# Patient Record
Sex: Female | Born: 1965 | Race: Black or African American | Hispanic: No | Marital: Single | Smoking: Never smoker
Health system: Southern US, Community
[De-identification: ages and names within clinical notes are randomized; demographics above are authoritative.]

## PROBLEM LIST (undated history)

## (undated) HISTORY — PX: ABDOMINAL HYSTERECTOMY: SHX81

---

## 2017-07-21 ENCOUNTER — Encounter (HOSPITAL_COMMUNITY): Payer: Self-pay

## 2017-07-21 ENCOUNTER — Emergency Department (HOSPITAL_COMMUNITY)
Admission: EM | Admit: 2017-07-21 | Discharge: 2017-07-21 | Disposition: A | Discharge status: Home / Self Care | Payer: Self-pay | Attending: Emergency Medicine | Admitting: Emergency Medicine

## 2017-07-21 DIAGNOSIS — S39012A Strain of muscle, fascia and tendon of lower back, initial encounter: Secondary | ICD-10-CM | POA: Insufficient documentation

## 2017-07-21 DIAGNOSIS — X509XXA Other and unspecified overexertion or strenuous movements or postures, initial encounter: Secondary | ICD-10-CM | POA: Insufficient documentation

## 2017-07-21 DIAGNOSIS — Y999 Unspecified external cause status: Secondary | ICD-10-CM | POA: Insufficient documentation

## 2017-07-21 DIAGNOSIS — Y9389 Activity, other specified: Secondary | ICD-10-CM | POA: Insufficient documentation

## 2017-07-21 DIAGNOSIS — Y929 Unspecified place or not applicable: Secondary | ICD-10-CM | POA: Insufficient documentation

## 2017-07-21 MED ORDER — CYCLOBENZAPRINE HCL 10 MG PO TABS: 10.0000 mg | ORAL_TABLET | Freq: Two times a day (BID) | ORAL | 0 refills | Status: AC | PRN

## 2017-07-21 MED ORDER — DICLOFENAC SODIUM 50 MG PO TBEC: 50.0000 mg | DELAYED_RELEASE_TABLET | Freq: Two times a day (BID) | ORAL | 0 refills | Status: AC

## 2017-07-21 NOTE — ED Notes (Signed)
Pt requesting to speak with registration about her medicaid prior to leaving. Registration made aware.

## 2017-07-21 NOTE — ED Triage Notes (Signed)
Pt reports lower R sided back pain following lifting her daughter's wheelchair out of the car. She states it happened around 3/ A&Ox4. Ambulatory. She states "I think I pulled something."

## 2017-07-21 NOTE — Discharge Instructions (Signed)
Do not drive while taking the muscle relaxant as it can make you sleepy °

## 2017-07-21 NOTE — ED Provider Notes (Signed)
[facility]-EMERGENCY DEPT Provider Note   CSN: [id number] Arrival date & time: 07/21/17  1538   By signing my name below, I, Soijett Blue, attest that this documentation has been prepared under the direction and in the presence of Kerrie BuffaloHope Kymorah Korf, NP Electronically Signed: Soijett Blue, ED Scribe. 07/21/17. 5:07 PM.  History   Chief Complaint Chief Complaint  Patient presents with  . Back Pain    HPI Joy Richardson is a [age] y.o. female who presents to the Emergency Department complaining of right lower back pain onset today. Pt has tried a lidocaine patch without medications for the relief of her symptoms. Pt reports that she was lifting her daughters wheelchair out of her car prior to the onset of her symptoms. Pt states that the wheelchair got caught on her pants which caused it to fall to the ground and she felt a pull to her right lower back. She states that she had a prior similar injury 3 weeks ago when she attempted to pick up her daughter. Pt right lower back pain is exacerbated with sitting and alleviated with standing. Pt denies bowel/bladder incontinence, numbness, tingling, nausea, vomiting, fever, chills, dysuria, urinary frequency, color change, wound, and any other symptoms.     The history is provided by the patient. No language interpreter was used.  Back Pain   This is a recurrent problem. The current episode started 3 to 5 hours ago. The problem occurs every several days. The problem has not changed since onset.The pain is associated with lifting heavy objects. The pain is present in the lumbar spine. The pain does not radiate. Exacerbated by: sitting. The pain is the same all the time. Pertinent negatives include no fever, no numbness and no dysuria.    History reviewed. No pertinent past medical history.  There are no active problems to display for this patient.   No past surgical history on file.  OB History    No data available       Home Medications    Prior to  Admission medications   Medication Sig Start Date End Date Taking? Authorizing Provider  cyclobenzaprine (FLEXERIL) 10 MG tablet Take 1 tablet (10 mg total) by mouth 2 (two) times daily as needed for muscle spasms. 07/21/17   Janne NapoleonNeese, Matsue Strom M, NP  diclofenac (VOLTAREN) 50 MG EC tablet Take 1 tablet (50 mg total) by mouth 2 (two) times daily. 07/21/17   Janne NapoleonNeese, Meg Niemeier M, NP    Family History History reviewed. No pertinent family history.  Social History Social History  Substance Use Topics  . Smoking status: Not on file  . Smokeless tobacco: Not on file  . Alcohol use Not on file     Allergies   Patient has no known allergies.   Review of Systems Review of Systems  Constitutional: Negative for chills and fever.  Gastrointestinal: Negative for nausea and vomiting.       No bowel incontinence  Genitourinary: Negative for difficulty urinating, dysuria, frequency and urgency.       No bladder incontinence  Musculoskeletal: Positive for back pain (right lower). Negative for joint swelling.  Skin: Negative for color change and wound.  Neurological: Negative for numbness.       No tingling  All other systems reviewed and are negative.    Physical Exam Updated Vital Signs BP 127/89 (BP Location: Left Arm)   Pulse (!) 57   Temp 98.2 F (36.8 C) (Oral)   Resp 20   Ht 5\' 7"  (1.702 m)  Wt 275 lb (124.7 kg)   SpO2 97%   BMI 43.07 kg/m   Physical Exam  Constitutional: She appears well-developed and well-nourished. No distress.  HENT:  Head: Normocephalic and atraumatic.  Eyes: EOM are normal.  Neck: Neck supple.  Cardiovascular: Normal rate and regular rhythm.   Pulses:      Radial pulses are 2+ on the right side, and 2+ on the left side.  Radial pulses are 2+.   Pulmonary/Chest: Effort normal and breath sounds normal.  Abdominal: Soft. There is no tenderness. There is no CVA tenderness.  Musculoskeletal: Normal range of motion.       Lumbar back: She exhibits tenderness and  spasm. She exhibits no deformity and normal pulse. Decreased range of motion: due to pain.  Tenderness to the right lower lumbar area. Grips are equal.   Neurological: She is alert. She has normal strength. Gait normal.  Reflex Scores:      Bicep reflexes are 2+ on the right side and 2+ on the left side.      Brachioradialis reflexes are 2+ on the right side and 2+ on the left side.      Patellar reflexes are 2+ on the right side and 2+ on the left side. Skin: Skin is warm and dry.  Psychiatric: She has a normal mood and affect. Her behavior is normal.  Nursing note and vitals reviewed.    ED Treatments / Results  DIAGNOSTIC STUDIES: Oxygen Saturation is 97% on RA, nl by my interpretation.    COORDINATION OF CARE: 4:54 PM Discussed treatment plan with pt at bedside and pt agreed to plan.   Labs (all labs ordered are listed, but only abnormal results are displayed) Labs Reviewed - No data to display  Procedures Procedures (including critical care time)  Medications Ordered in ED Medications - No data to display   Initial Impression / Assessment and Plan / ED Course  I have reviewed the triage vital signs and the nursing notes.  Patient with back pain.  No neurological deficits and normal neuro exam.  Patient is ambulatory.  No loss of bowel or bladder control.  No concern for cauda equina.  No fever, night sweats, weight loss, h/o cancer, IVDA, no recent procedure to back. No urinary symptoms suggestive of UTI.  Pt will be discharged home with flexeril and voltaren prescription. Supportive care and return precaution discussed. Appears safe for discharge at this time. Follow up as indicated in discharge paperwork.   Final Clinical Impressions(s) / ED Diagnoses   Final diagnoses:  Lumbosacral strain, initial encounter    New Prescriptions New Prescriptions   CYCLOBENZAPRINE (FLEXERIL) 10 MG TABLET    Take 1 tablet (10 mg total) by mouth 2 (two) times daily as needed for  muscle spasms.   DICLOFENAC (VOLTAREN) 50 MG EC TABLET    Take 1 tablet (50 mg total) by mouth 2 (two) times daily.  I personally performed the services described in this documentation, which was scribed in my presence. The recorded information has been reviewed and is accurate.    Kerrie Buffalo [place], [place] 07/21/17 Garnette Scheuermann    Benjiman Core, MD 07/22/17 [phone]

## 2018-02-22 ENCOUNTER — Encounter (HOSPITAL_COMMUNITY): Payer: Self-pay | Admitting: Emergency Medicine

## 2018-02-22 ENCOUNTER — Emergency Department (HOSPITAL_COMMUNITY)
Admission: EM | Admit: 2018-02-22 | Discharge: 2018-02-22 | Disposition: A | Discharge status: Home / Self Care | Payer: Self-pay | Attending: Emergency Medicine | Admitting: Emergency Medicine

## 2018-02-22 ENCOUNTER — Emergency Department (HOSPITAL_COMMUNITY): Payer: Self-pay

## 2018-02-22 DIAGNOSIS — B9789 Other viral agents as the cause of diseases classified elsewhere: Secondary | ICD-10-CM | POA: Insufficient documentation

## 2018-02-22 DIAGNOSIS — J40 Bronchitis, not specified as acute or chronic: Secondary | ICD-10-CM

## 2018-02-22 DIAGNOSIS — Z79899 Other long term (current) drug therapy: Secondary | ICD-10-CM | POA: Insufficient documentation

## 2018-02-22 DIAGNOSIS — J069 Acute upper respiratory infection, unspecified: Secondary | ICD-10-CM

## 2018-02-22 MED ORDER — ACETAMINOPHEN-CODEINE 120-12 MG/5ML PO SOLN: 10.0000 mL | ORAL | 0 refills | Status: AC | PRN

## 2018-02-22 MED ORDER — IPRATROPIUM BROMIDE 0.02 % IN SOLN
0.5000 mg | Freq: Once | RESPIRATORY_TRACT | Status: AC
  Administered 2018-02-22: 0.5 mg via RESPIRATORY_TRACT
  Filled 2018-02-22: qty 2.5

## 2018-02-22 MED ORDER — ALBUTEROL SULFATE (2.5 MG/3ML) 0.083% IN NEBU
5.0000 mg | INHALATION_SOLUTION | Freq: Once | RESPIRATORY_TRACT | Status: DC
Start: 2018-02-22 — End: 2018-02-22

## 2018-02-22 MED ORDER — GUAIFENESIN ER 1200 MG PO TB12: 1.0000 | ORAL_TABLET | Freq: Two times a day (BID) | ORAL | 0 refills | Status: AC

## 2018-02-22 MED ORDER — PREDNISONE 50 MG PO TABS: 50.0000 mg | ORAL_TABLET | Freq: Every day | ORAL | 0 refills | Status: DC

## 2018-02-22 MED ORDER — ALBUTEROL SULFATE (2.5 MG/3ML) 0.083% IN NEBU
5.0000 mg | INHALATION_SOLUTION | Freq: Once | RESPIRATORY_TRACT | Status: AC
  Administered 2018-02-22: 5 mg via RESPIRATORY_TRACT
  Filled 2018-02-22: qty 6

## 2018-02-22 NOTE — ED Triage Notes (Signed)
Per pt, states chest/nasal congestion, productive cough on and off for 2 weeks-states increased symptoms, especially cough yesterday-

## 2018-02-22 NOTE — ED Provider Notes (Signed)
[facility]-EMERGENCY DEPT Provider Note   CSN: [id number] Arrival date & time: 02/22/18  [id number]     History   Chief Complaint Chief Complaint  Patient presents with  . URI    HPI Joy Richardson is a 52 y.o. female.  HPI Patient presents to the emergency department with nasal congestion and productive cough over the last 2 weeks.  The patient states that her cough seems to bother her more at night.  The patient states that she did not take any medications other than TheraFlu prior to arrival which did not seem to help significantly with her symptoms.  Patient states that she has not been around anyone sick that she knows of.  The patient denies chest pain, shortness of breath, headache,blurred vision, neck pain, fever,  weakness, numbness, dizziness, anorexia, edema, abdominal pain, nausea, vomiting, diarrhea, rash, back pain, dysuria, hematemesis, bloody stool, near syncope, or syncope. History reviewed. No pertinent past medical history.  There are no active problems to display for this patient.    OB History    No data available       Home Medications    Prior to Admission medications   Medication Sig Start Date End Date Taking? Authorizing Provider  cyclobenzaprine (FLEXERIL) 10 MG tablet Take 1 tablet (10 mg total) by mouth 2 (two) times daily as needed for muscle spasms. 07/21/17  Yes Neese, Hope M, NP  diclofenac (VOLTAREN) 50 MG EC tablet Take 1 tablet (50 mg total) by mouth 2 (two) times daily. 07/21/17  Yes Janne NapoleonNeese, Hope M, NP    Family History No family history on file.  Social History Social History   Tobacco Use  . Smoking status: Never Smoker  Substance Use Topics  . Alcohol use: No    Frequency: Never  . Drug use: No     Allergies   Patient has no known allergies.   Review of Systems Review of Systems All other systems negative except as documented in the HPI. All pertinent positives and negatives as reviewed in the  HPI.  Physical Exam Updated Vital Signs BP (!) 165/96 (BP Location: Right Arm)   Pulse 66   Temp 98.2 F (36.8 C) (Oral)   Resp 18   Ht 5\' 7"  (1.702 m)   Wt 127 kg (280 lb)   SpO2 98%   BMI 43.85 kg/m   Physical Exam  Constitutional: She is oriented to person, place, and time. She appears well-developed and well-nourished. No distress.  HENT:  Head: Normocephalic and atraumatic.  Mouth/Throat: Oropharynx is clear and moist.  Eyes: Pupils are equal, round, and reactive to light.  Neck: Normal range of motion. Neck supple.  Cardiovascular: Normal rate, regular rhythm and normal heart sounds. Exam reveals no gallop and no friction rub.  No murmur heard. Pulmonary/Chest: Effort normal and breath sounds normal. No respiratory distress. She has no wheezes.  Neurological: She is alert and oriented to person, place, and time. She exhibits normal muscle tone. Coordination normal.  Skin: Skin is warm and dry. Capillary refill takes less than 2 seconds. No rash noted. No erythema.  Psychiatric: She has a normal mood and affect. Her behavior is normal.  Nursing note and vitals reviewed.    ED Treatments / Results  Labs (all labs ordered are listed, but only abnormal results are displayed) Labs Reviewed - No data to display  EKG  EKG Interpretation None       Radiology Dg Chest 2 View  Result Date: 02/22/2018  CLINICAL DATA:  Shortness of breath with congestion, wheezing, cough and fever. EXAM: CHEST - 2 VIEW COMPARISON:  None. FINDINGS: The heart size and mediastinal contours are within normal limits. Both lungs are clear. The visualized skeletal structures are unremarkable. IMPRESSION: No active cardiopulmonary disease. Electronically Signed   By: Elberta Fortis M.D.   On: 02/22/2018 09:14    Procedures Procedures (including critical care time)  Medications Ordered in ED Medications  albuterol (PROVENTIL) (2.5 MG/3ML) 0.083% nebulizer solution 5 mg (5 mg Nebulization Given  02/22/18 1132)  ipratropium (ATROVENT) nebulizer solution 0.5 mg (0.5 mg Nebulization Given 02/22/18 1132)     Initial Impression / Assessment and Plan / ED Course  I have reviewed the triage vital signs and the nursing notes.  Pertinent labs & imaging results that were available during my care of the patient were reviewed by me and considered in my medical decision making (see chart for details).     Patient be treated for bronchitis and advised to follow-up with her primary doctor.  Told return here as needed.  Patient agrees with plan and all questions were answered.  Final Clinical Impressions(s) / ED Diagnoses   Final diagnoses:  None    ED Discharge Orders    None       Charlestine Night, PA-C 02/26/18 1601    Cathren Laine, MD 02/27/18 [phone]

## 2018-02-22 NOTE — Discharge Instructions (Signed)
Return here as needed.  Increase your fluid intake and rest as much as possible.  °

## 2018-07-21 ENCOUNTER — Emergency Department (HOSPITAL_COMMUNITY)
Admission: EM | Admit: 2018-07-21 | Discharge: 2018-07-21 | Disposition: A | Discharge status: Home / Self Care | Payer: Medicaid Other | Attending: Emergency Medicine | Admitting: Emergency Medicine

## 2018-07-21 ENCOUNTER — Other Ambulatory Visit: Payer: Self-pay

## 2018-07-21 ENCOUNTER — Encounter (HOSPITAL_COMMUNITY): Payer: Self-pay | Admitting: Obstetrics and Gynecology

## 2018-07-21 DIAGNOSIS — Z79899 Other long term (current) drug therapy: Secondary | ICD-10-CM | POA: Insufficient documentation

## 2018-07-21 DIAGNOSIS — L02411 Cutaneous abscess of right axilla: Secondary | ICD-10-CM

## 2018-07-21 MED ORDER — SULFAMETHOXAZOLE-TRIMETHOPRIM 800-160 MG PO TABS: 1.0000 | ORAL_TABLET | Freq: Two times a day (BID) | ORAL | 0 refills | Status: AC

## 2018-07-21 MED ORDER — SULFAMETHOXAZOLE-TRIMETHOPRIM 800-160 MG PO TABS: 1.0000 | ORAL_TABLET | Freq: Two times a day (BID) | ORAL | 0 refills | Status: DC

## 2018-07-21 NOTE — Discharge Instructions (Addendum)
Warm Epsom salt compresses to right armpit area for 20 minutes at a time. Take Septra as prescribed and complete the full course. Return to ER for any worsening or concerning symptoms.

## 2018-07-21 NOTE — ED Provider Notes (Signed)
[facility]   CSN: [id number] Arrival date & time: 07/21/18  1510     History   Chief Complaint Chief Complaint  Patient presents with  . Abscess    HPI Joy Richardson is a 52 y.o. female.  52 year old female presents with complaint of abscess in the right axillary area.  Patient first noticed pain in the area a few days ago, has been applying warm compresses, now has purulent drainage from the area.  Patient reports similar episode back in May, did not have I&D at that time.  No history of hidradenitis, no history of MRSA.  No other complaints or concerns.     History reviewed. No pertinent past medical history.  There are no active problems to display for this patient.   Past Surgical History:  Procedure Laterality Date  . ABDOMINAL HYSTERECTOMY       OB History    Gravida      Para      Term      Preterm      AB      Living  5     SAB      TAB      Ectopic      Multiple      Live Births               Home Medications    Prior to Admission medications   Medication Sig Start Date End Date Taking? Authorizing Provider  acetaminophen-codeine 120-12 MG/5ML solution Take 10 mLs by mouth every 4 (four) hours as needed (PRN cough). 02/22/18   Lawyer, Cristal Deerhristopher, PA-C  cyclobenzaprine (FLEXERIL) 10 MG tablet Take 1 tablet (10 mg total) by mouth 2 (two) times daily as needed for muscle spasms. 07/21/17   Janne NapoleonNeese, Hope M, NP  diclofenac (VOLTAREN) 50 MG EC tablet Take 1 tablet (50 mg total) by mouth 2 (two) times daily. 07/21/17   Janne NapoleonNeese, Hope M, NP  Guaifenesin 1200 MG TB12 Take 1 tablet (1,200 mg total) by mouth 2 (two) times daily. 02/22/18   Lawyer, Cristal Deerhristopher, PA-C  predniSONE (DELTASONE) 50 MG tablet Take 1 tablet (50 mg total) by mouth daily with breakfast. 02/22/18   Lawyer, Cristal Deerhristopher, PA-C  sulfamethoxazole-trimethoprim (BACTRIM DS,SEPTRA DS) 800-160 MG tablet Take 1 tablet by mouth 2 (two) times  daily for 7 days. 07/21/18 07/28/18  Jeannie FendMurphy, Nicolas Banh A, PA-C    Family History No family history on file.  Social History Social History   Tobacco Use  . Smoking status: Never Smoker  . Smokeless tobacco: Never Used  Substance Use Topics  . Alcohol use: No    Frequency: Never  . Drug use: No     Allergies   Patient has no known allergies.   Review of Systems Review of Systems  Constitutional: Negative for fever.  Gastrointestinal: Negative for nausea and vomiting.  Musculoskeletal: Negative for arthralgias and myalgias.  Skin: Positive for color change. Negative for rash and wound.  Allergic/Immunologic: Negative for immunocompromised state.  Hematological: Negative for adenopathy.  All other systems reviewed and are negative.    Physical Exam Updated Vital Signs BP (!) 156/94 (BP Location: Right Arm)   Pulse 72   Temp 98.3 F (36.8 C)   Resp 16   Ht 5\' 7"  (1.702 m)   Wt 124.7 kg   SpO2 99%   BMI 43.07 kg/m   Physical Exam  Constitutional: She is oriented to person, place, and time. She appears well-developed and well-nourished.  No distress.  HENT:  Head: Normocephalic and atraumatic.  Cardiovascular: Intact distal pulses.  Pulmonary/Chest: Effort normal.  Musculoskeletal: She exhibits tenderness. She exhibits no deformity.  Neurological: She is alert and oriented to person, place, and time.  Skin: Skin is warm and dry. She is not diaphoretic. There is erythema.     Psychiatric: She has a normal mood and affect. Her behavior is normal.  Nursing note and vitals reviewed.    ED Treatments / Results  Labs (all labs ordered are listed, but only abnormal results are displayed) Labs Reviewed - No data to display  EKG None  Radiology No results found.  Procedures Procedures (including critical care time)  Medications Ordered in ED Medications - No data to display   Initial Impression / Assessment and Plan / ED Course  I have reviewed the triage  vital signs and the nursing notes.  Pertinent labs & imaging results that were available during my care of the patient were reviewed by me and considered in my medical decision making (see chart for details).  Clinical Course as of Jul 21 1621  Wed Jul 21, 2018  [id number] 52 year old female with right axillary abscess, draining at this time.  Patient declines I&D of area at this time and would prefer to try warm compresses and antibiotics and allow the area to continue to drain at home.  Advised patient to return to the ER at any time if this is not improving for I&D.   [LM]    Clinical Course User Index [LM] Jeannie FendMurphy, Adalynne Steffensmeier A, PA-C    Final Clinical Impressions(s) / ED Diagnoses   Final diagnoses:  Abscess of axilla, right    ED Discharge Orders         Ordered    sulfamethoxazole-trimethoprim (BACTRIM DS,SEPTRA DS) 800-160 MG tablet  2 times daily,   Status:  Discontinued     07/21/18 1602    sulfamethoxazole-trimethoprim (BACTRIM DS,SEPTRA DS) 800-160 MG tablet  2 times daily     07/21/18 1619           Jeannie FendMurphy, Jasia Hiltunen A, PA-C 07/21/18 1622    Bethann BerkshireZammit, Joseph, MD 07/22/18 1214

## 2018-07-21 NOTE — ED Triage Notes (Signed)
Pt reports she has an abscess in her right armpit. RN visualized abscess and there is purulent drainage noted in the area.

## 2020-04-06 ENCOUNTER — Encounter (HOSPITAL_COMMUNITY): Payer: Self-pay

## 2020-04-06 ENCOUNTER — Emergency Department (HOSPITAL_COMMUNITY): Payer: Self-pay

## 2020-04-06 ENCOUNTER — Other Ambulatory Visit: Payer: Self-pay

## 2020-04-06 ENCOUNTER — Emergency Department (HOSPITAL_COMMUNITY)
Admission: EM | Admit: 2020-04-06 | Discharge: 2020-04-06 | Disposition: A | Discharge status: Home / Self Care | Payer: Self-pay | Attending: Emergency Medicine | Admitting: Emergency Medicine

## 2020-04-06 DIAGNOSIS — R42 Dizziness and giddiness: Secondary | ICD-10-CM | POA: Insufficient documentation

## 2020-04-06 DIAGNOSIS — R0602 Shortness of breath: Secondary | ICD-10-CM | POA: Insufficient documentation

## 2020-04-06 DIAGNOSIS — J069 Acute upper respiratory infection, unspecified: Secondary | ICD-10-CM | POA: Insufficient documentation

## 2020-04-06 DIAGNOSIS — Z20822 Contact with and (suspected) exposure to covid-19: Secondary | ICD-10-CM | POA: Insufficient documentation

## 2020-04-06 LAB — URINALYSIS, ROUTINE W REFLEX MICROSCOPIC
Bacteria, UA: NONE SEEN
Bilirubin Urine: NEGATIVE
Glucose, UA: NEGATIVE mg/dL
Ketones, ur: NEGATIVE mg/dL
Leukocytes,Ua: NEGATIVE
Nitrite: NEGATIVE
Protein, ur: NEGATIVE mg/dL
Specific Gravity, Urine: 1.017 (ref 1.005–1.030)
pH: 7 (ref 5.0–8.0)

## 2020-04-06 LAB — CBC
HCT: 37.1 % (ref 36.0–46.0)
Hemoglobin: 11.1 g/dL — ABNORMAL LOW (ref 12.0–15.0)
MCH: 24.2 pg — ABNORMAL LOW (ref 26.0–34.0)
MCHC: 29.9 g/dL — ABNORMAL LOW (ref 30.0–36.0)
MCV: 80.8 fL (ref 80.0–100.0)
Platelets: 373 10*3/uL (ref 150–400)
RBC: 4.59 MIL/uL (ref 3.87–5.11)
RDW: 15.2 % (ref 11.5–15.5)
WBC: 7.8 10*3/uL (ref 4.0–10.5)
nRBC: 0 % (ref 0.0–0.2)

## 2020-04-06 LAB — COMPREHENSIVE METABOLIC PANEL
ALT: 10 U/L (ref 0–44)
AST: 13 U/L — ABNORMAL LOW (ref 15–41)
Albumin: 3.7 g/dL (ref 3.5–5.0)
Alkaline Phosphatase: 88 U/L (ref 38–126)
Anion gap: 6 (ref 5–15)
BUN: 9 mg/dL (ref 6–20)
CO2: 27 mmol/L (ref 22–32)
Calcium: 9.1 mg/dL (ref 8.9–10.3)
Chloride: 104 mmol/L (ref 98–111)
Creatinine, Ser: 0.72 mg/dL (ref 0.44–1.00)
GFR calc Af Amer: >60 mL/min (ref >60)
GFR calc non Af Amer: >60 mL/min (ref >60)
Glucose, Bld: 111 mg/dL — ABNORMAL HIGH (ref 70–99)
Potassium: 4 mmol/L (ref 3.5–5.1)
Sodium: 137 mmol/L (ref 135–145)
Total Bilirubin: 0.4 mg/dL (ref 0.3–1.2)
Total Protein: 8.4 g/dL — ABNORMAL HIGH (ref 6.5–8.1)

## 2020-04-06 LAB — RESPIRATORY PANEL BY RT PCR (FLU A&B, COVID)
Influenza A by PCR: NEGATIVE
Influenza B by PCR: NEGATIVE
SARS Coronavirus 2 by RT PCR: NEGATIVE

## 2020-04-06 LAB — I-STAT BETA HCG BLOOD, ED (NOT ORDERABLE): I-stat hCG, quantitative: <5 m[IU]/mL (ref <5)

## 2020-04-06 LAB — TROPONIN I (HIGH SENSITIVITY)
Troponin I (High Sensitivity): 7 ng/L (ref <18)
Troponin I (High Sensitivity): 6 ng/L (ref <18)

## 2020-04-06 LAB — D-DIMER, QUANTITATIVE (NOT AT ARMC): D-Dimer, Quant: <0.27 ug/mL-FEU (ref 0.00–0.50)

## 2020-04-06 LAB — POC SARS CORONAVIRUS 2 AG: SARS Coronavirus 2 Ag: NEGATIVE

## 2020-04-06 MED ORDER — SODIUM CHLORIDE 0.9% FLUSH: 3.0000 mL | Freq: Once | INTRAVENOUS | Status: DC

## 2020-04-06 MED ORDER — ALBUTEROL SULFATE HFA 108 (90 BASE) MCG/ACT IN AERS
4.0000 | INHALATION_SPRAY | Freq: Once | RESPIRATORY_TRACT | Status: AC
  Administered 2020-04-06: 05:00:00 4 via RESPIRATORY_TRACT
  Filled 2020-04-06: qty 6.7

## 2020-04-06 MED ORDER — PREDNISONE 20 MG PO TABS
60.0000 mg | ORAL_TABLET | Freq: Once | ORAL | Status: AC
  Administered 2020-04-06: 04:00:00 60 mg via ORAL
  Filled 2020-04-06: qty 3

## 2020-04-06 MED ORDER — ALBUTEROL SULFATE HFA 108 (90 BASE) MCG/ACT IN AERS: 1.0000 | INHALATION_SPRAY | Freq: Four times a day (QID) | RESPIRATORY_TRACT | 0 refills | Status: AC | PRN

## 2020-04-06 MED ORDER — DOXYCYCLINE HYCLATE 100 MG PO CAPS
100.0000 mg | ORAL_CAPSULE | Freq: Two times a day (BID) | ORAL | 0 refills | Status: AC
Start: 2020-04-06 — End: ?

## 2020-04-06 MED ORDER — PREDNISONE 50 MG PO TABS
ORAL_TABLET | ORAL | 0 refills | Status: AC
Start: 2020-04-06 — End: ?

## 2020-04-06 NOTE — ED Notes (Signed)
Pt educated on discharge instructions and medication. Pt verbalized understanding and denies questions pt a/o and ambulatory at discharge.

## 2020-04-06 NOTE — Discharge Instructions (Signed)
Use the inhaler and take the steroids as prescribed.  Establish care with a primary physician.  Return to the ED with chest pain, shortness of breath, difficulty swallowing, difficulty breathing or any other concerns.

## 2020-04-06 NOTE — ED Provider Notes (Signed)
[facility]-EMERGENCY DEPT Provider Note   CSN: [id number] Arrival date & time: 04/06/20  0007     History Chief Complaint  Patient presents with  . Cough    Joy Richardson is a 54 y.o. female.  Patient here with cough productive of clear mucus for the past 6 months.  States she feels like she has a hard time catching her breath and was lightheaded at times when she coughs.  The cough seems to be worse at night when she lies down.  She denies any history of smoking or asthma or COPD.  She was seen for this cough several months ago and given albuterol inhaler with some relief.  She became concerned tonight because she was coughing while lying down and felt like she could not catch her breath and felt her heart racing.  This has since resolved.  She never did have chest pain.  She denies any leg pain or leg swelling.  His cough is productive of clear mucus only.  No fever.  She feels lightheaded occasionally when she coughs.  Does not take any medications on a regular basis.  Denies any history of sleep apnea.  Denies any history of COPD or asthma.  No history of ulcers or acid reflux.  The history is provided by the patient.  Cough Associated symptoms: no fever, no headaches, no myalgias, no rash, no rhinorrhea and no shortness of breath        History reviewed. No pertinent past medical history.  There are no problems to display for this patient.   Past Surgical History:  Procedure Laterality Date  . ABDOMINAL HYSTERECTOMY       OB History    Gravida      Para      Term      Preterm      AB      Living  5     SAB      TAB      Ectopic      Multiple      Live Births              No family history on file.  Social History   Tobacco Use  . Smoking status: Never Smoker  . Smokeless tobacco: Never Used  Substance Use Topics  . Alcohol use: No  . Drug use: No    Home Medications Prior to Admission medications   Medication  Sig Start Date End Date Taking? Authorizing Provider  acetaminophen-codeine 120-12 MG/5ML solution Take 10 mLs by mouth every 4 (four) hours as needed (PRN cough). 02/22/18   Lawyer, Cristal Deer, PA-C  cyclobenzaprine (FLEXERIL) 10 MG tablet Take 1 tablet (10 mg total) by mouth 2 (two) times daily as needed for muscle spasms. 07/21/17   Janne Napoleon, NP  diclofenac (VOLTAREN) 50 MG EC tablet Take 1 tablet (50 mg total) by mouth 2 (two) times daily. 07/21/17   Janne Napoleon, NP  Guaifenesin 1200 MG TB12 Take 1 tablet (1,200 mg total) by mouth 2 (two) times daily. 02/22/18   Lawyer, Cristal Deer, PA-C  predniSONE (DELTASONE) 50 MG tablet Take 1 tablet (50 mg total) by mouth daily with breakfast. 02/22/18   Charlestine Night, PA-C    Allergies    Patient has no known allergies.  Review of Systems   Review of Systems  Constitutional: Negative for activity change, appetite change, fatigue and fever.  HENT: Negative for congestion and rhinorrhea.   Respiratory: Positive for cough and  choking. Negative for shortness of breath.   Cardiovascular: Negative for leg swelling.  Gastrointestinal: Negative for abdominal pain, nausea and vomiting.  Genitourinary: Negative for dysuria and hematuria.  Musculoskeletal: Negative for arthralgias and myalgias.  Skin: Negative for rash.  Neurological: Negative for dizziness, weakness and headaches.   all other systems are negative except as noted in the HPI and PMH.    Physical Exam Updated Vital Signs BP (!) 169/101 (BP Location: Right Arm)   Pulse 88   Temp 98.3 F (36.8 C) (Oral)   Resp 16   Ht 5\' 7"  (1.702 m)   Wt 136.1 kg   SpO2 98%   BMI 46.99 kg/m   Physical Exam Vitals and nursing note reviewed.  Constitutional:      General: She is not in acute distress.    Appearance: She is well-developed. She is obese. She is not ill-appearing.     Comments: Speaking full sentences, no distress  HENT:     Head: Normocephalic and atraumatic.      Mouth/Throat:     Pharynx: No oropharyngeal exudate.  Eyes:     Conjunctiva/sclera: Conjunctivae normal.     Pupils: Pupils are equal, round, and reactive to light.  Neck:     Comments: No meningismus. Cardiovascular:     Rate and Rhythm: Normal rate and regular rhythm.     Heart sounds: Normal heart sounds. No murmur.  Pulmonary:     Effort: Pulmonary effort is normal. No respiratory distress.     Breath sounds: Wheezing present.     Comments: Expiratory wheezing bilaterally Chest:     Chest wall: No tenderness.  Abdominal:     Palpations: Abdomen is soft.     Tenderness: There is no abdominal tenderness. There is no guarding or rebound.  Musculoskeletal:        General: No tenderness. Normal range of motion.     Cervical back: Normal range of motion and neck supple.  Skin:    General: Skin is warm.     Capillary Refill: Capillary refill takes less than 2 seconds.  Neurological:     General: No focal deficit present.     Mental Status: She is alert and oriented to person, place, and time. Mental status is at baseline.     Motor: No abnormal muscle tone.     Coordination: Coordination normal.     Comments:  5/5 strength throughout. CN 2-12 intact.Equal grip strength.   Psychiatric:        Behavior: Behavior normal.     ED Results / Procedures / Treatments   Labs (all labs ordered are listed, but only abnormal results are displayed) Labs Reviewed  COMPREHENSIVE METABOLIC PANEL - Abnormal; Notable for the following components:      Result Value   Glucose, Bld 111 (*)    Total Protein 8.4 (*)    AST 13 (*)    All other components within normal limits  CBC - Abnormal; Notable for the following components:   Hemoglobin 11.1 (*)    MCH 24.2 (*)    MCHC 29.9 (*)    All other components within normal limits  URINALYSIS, ROUTINE W REFLEX MICROSCOPIC - Abnormal; Notable for the following components:   Hgb urine dipstick SMALL (*)    All other components within normal limits   RESPIRATORY PANEL BY RT PCR (FLU A&B, COVID)  D-DIMER, QUANTITATIVE (NOT AT [facility])  POC SARS CORONAVIRUS 2 AG -  ED  I-STAT BETA HCG BLOOD, ED (  [facility], [facility], [facility] ONLY)  I-STAT BETA HCG BLOOD, ED (NOT ORDERABLE)  POC SARS CORONAVIRUS 2 AG  TROPONIN I (HIGH SENSITIVITY)  TROPONIN I (HIGH SENSITIVITY)    EKG EKG Interpretation  Date/Time:  Friday April 06 2020 00:43:35 EDT Ventricular Rate:  79 PR Interval:    QRS Duration: 101 QT Interval:  510 QTC Calculation: 585 R Axis:   -30 Text Interpretation: Sinus rhythm Left ventricular hypertrophy Nonspecific T abnrm, anterolateral leads Prolonged QT interval 12 Lead; Mason-Likar No previous ECGs available Confirmed by Ezequiel Essex [phone]) on 04/06/2020 12:47:35 AM   Radiology DG Chest 2 View  Result Date: 04/06/2020 CLINICAL DATA:  Shortness of breath and cough EXAM: CHEST - 2 VIEW COMPARISON:  February 22, 2018 FINDINGS: The heart size and mediastinal contours are within normal limits. Both lungs are clear. The visualized skeletal structures are unremarkable. IMPRESSION: No active cardiopulmonary disease. Electronically Signed   By: Prudencio Pair M.D.   On: 04/06/2020 01:27    Procedures Procedures (including critical care time)  Medications Ordered in ED Medications  sodium chloride flush (NS) 0.9 % injection 3 mL (3 mLs Intravenous Not Given 04/06/20 0244)  albuterol (VENTOLIN HFA) 108 (90 Base) MCG/ACT inhaler 4 puff (has no administration in time range)  predniSONE (DELTASONE) tablet 60 mg (has no administration in time range)    ED Course  I have reviewed the triage vital signs and the nursing notes.  Pertinent labs & imaging results that were available during my care of the patient were reviewed by me and considered in my medical decision making (see chart for details).    MDM Rules/Calculators/A&P                     6 months of cough here with palpitations and lightheadedness after coughing spell.  Denies chest pain.  EKG is  sinus rhythm with T wave inversions, no comparison.  She denies any chest pain.  Troponin negative.  D-dimer is negative.  Patient given bronchodilators, steroids. Low suspicion for ACS or PE.   X-ray does not show any infiltrate.  Will treat empirically for bronchitis with course of steroids, albuterol and antibiotics.  Patient informed of her abnormal EKG and elevated blood pressure and need for follow-up with the PCP.  She denies any chest pain or back pain. Ambulatory without desaturation.  Needs to establish care with PCP. Return to the ED with chest pain, shortness of breath, any other concerns. Final Clinical Impression(s) / ED Diagnoses Final diagnoses:  Viral URI with cough    Rx / DC Orders ED Discharge Orders    None       Lariya Kinzie, Annie Main, MD 04/06/20 [phone]

## 2020-04-06 NOTE — ED Notes (Signed)
PT ambulated without increased SHOB O2 Saturation remained greater than 97%.

## 2020-04-06 NOTE — ED Triage Notes (Signed)
Pt states a cough that causes her to lose her breath for 6 months.

## 2021-10-17 IMAGING — CR DG CHEST 2V
2 series · 2 of 2 positions shown · non-contrast
Comparison: February 22, 2018

CLINICAL DATA: Shortness of breath and cough

EXAM:
CHEST - 2 VIEW

[w chest pa]
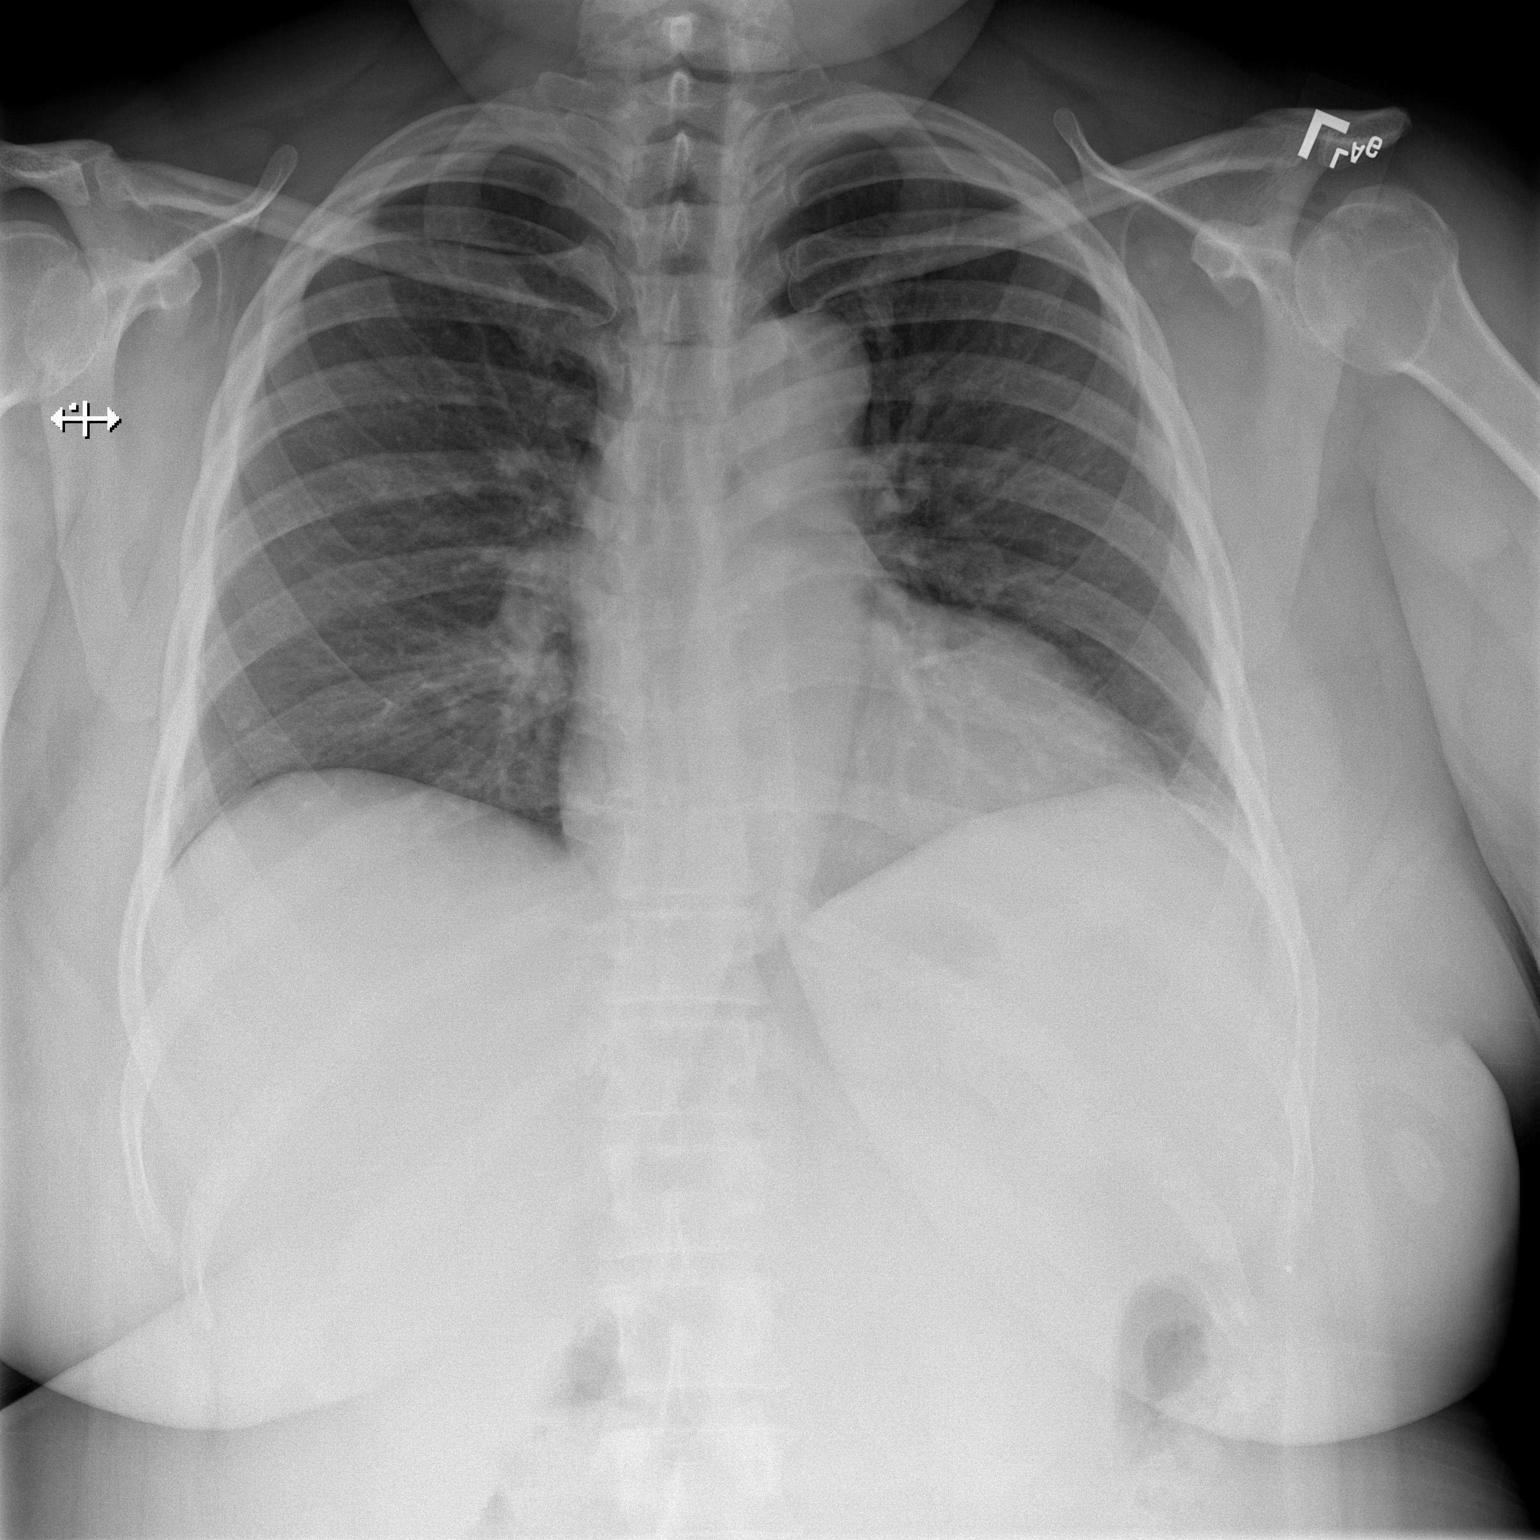

[w chest lat]
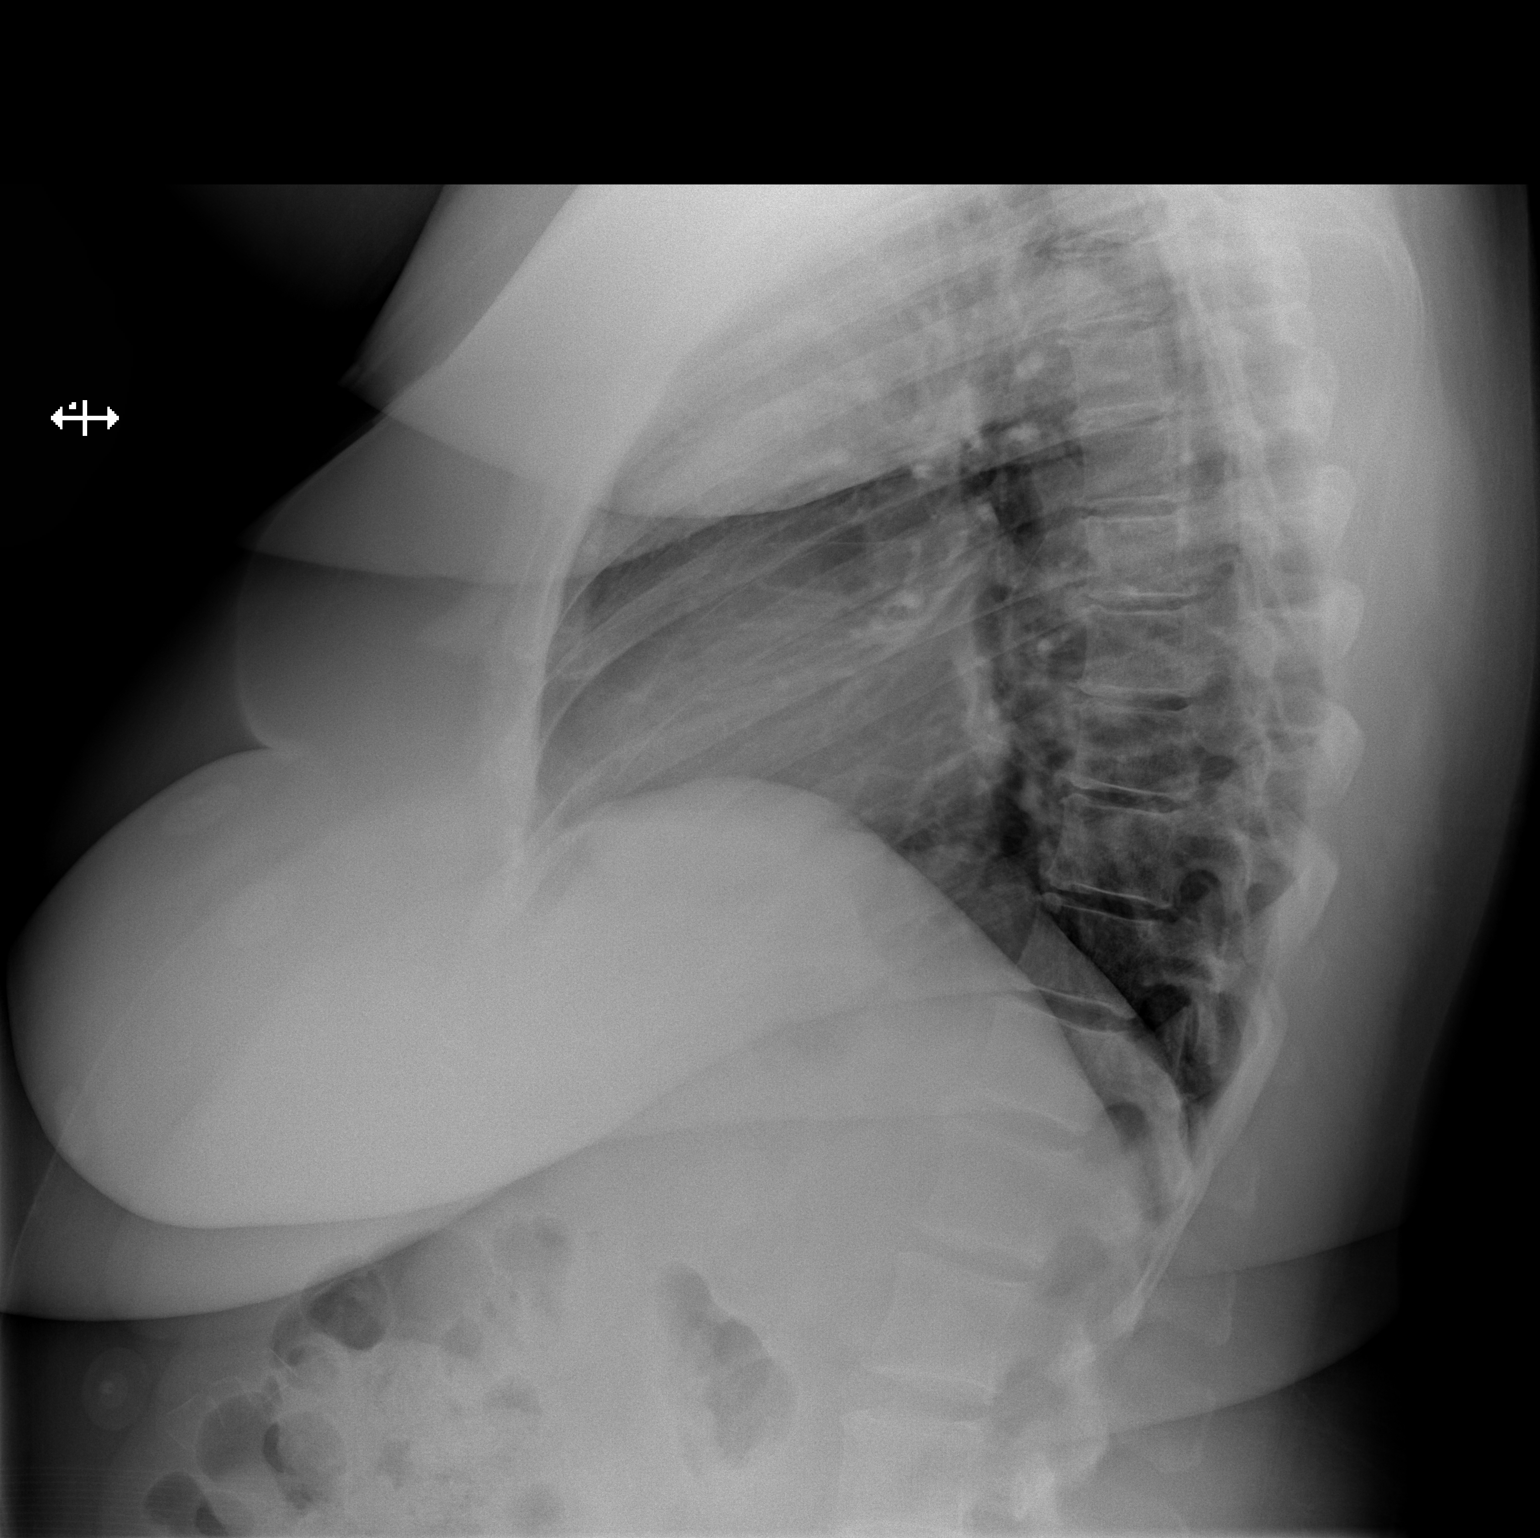

[2 of 2 positions shown; findings below may reference images not displayed]

FINDINGS: The heart size and mediastinal contours are within normal limits.
Both lungs are clear. The visualized skeletal structures are
unremarkable.
IMPRESSION: No active cardiopulmonary disease.

## 2022-10-23 ENCOUNTER — Other Ambulatory Visit: Payer: Self-pay

## 2022-10-23 ENCOUNTER — Emergency Department (HOSPITAL_COMMUNITY)
Admission: EM | Admit: 2022-10-23 | Discharge: 2022-10-23 | Disposition: A | Discharge status: Home / Self Care | Payer: Medicaid Other | Attending: Emergency Medicine | Admitting: Emergency Medicine

## 2022-10-23 ENCOUNTER — Emergency Department (HOSPITAL_COMMUNITY): Payer: Medicaid Other

## 2022-10-23 DIAGNOSIS — J45901 Unspecified asthma with (acute) exacerbation: Secondary | ICD-10-CM | POA: Insufficient documentation

## 2022-10-23 DIAGNOSIS — Z20822 Contact with and (suspected) exposure to covid-19: Secondary | ICD-10-CM | POA: Insufficient documentation

## 2022-10-23 LAB — BASIC METABOLIC PANEL
Anion gap: 6 (ref 5–15)
BUN: 9 mg/dL (ref 6–20)
CO2: 29 mmol/L (ref 22–32)
Calcium: 8.7 mg/dL — ABNORMAL LOW (ref 8.9–10.3)
Chloride: 104 mmol/L (ref 98–111)
Creatinine, Ser: 0.85 mg/dL (ref 0.44–1.00)
GFR, Estimated: >60 mL/min (ref >60)
Glucose, Bld: 78 mg/dL (ref 70–99)
Potassium: 3.7 mmol/L (ref 3.5–5.1)
Sodium: 139 mmol/L (ref 135–145)

## 2022-10-23 LAB — RESP PANEL BY RT-PCR (FLU A&B, COVID) ARPGX2
Influenza A by PCR: NEGATIVE
Influenza B by PCR: NEGATIVE
SARS Coronavirus 2 by RT PCR: NEGATIVE

## 2022-10-23 LAB — BRAIN NATRIURETIC PEPTIDE: B Natriuretic Peptide: 14.5 pg/mL (ref 0.0–100.0)

## 2022-10-23 MED ORDER — PREDNISONE 10 MG PO TABS: 60.0000 mg | ORAL_TABLET | Freq: Every day | ORAL | 0 refills | Status: AC

## 2022-10-23 MED ORDER — DEXAMETHASONE SODIUM PHOSPHATE 10 MG/ML IJ SOLN
10.0000 mg | Freq: Once | INTRAMUSCULAR | Status: AC
  Administered 2022-10-23: 10 mg via INTRAMUSCULAR
  Filled 2022-10-23: qty 1

## 2022-10-23 MED ORDER — OXYMETAZOLINE HCL 0.05 % NA SOLN
1.0000 | Freq: Once | NASAL | Status: AC
  Administered 2022-10-23: 1 via NASAL
  Filled 2022-10-23: qty 30

## 2022-10-23 MED ORDER — ALBUTEROL SULFATE (2.5 MG/3ML) 0.083% IN NEBU
10.0000 mg | INHALATION_SOLUTION | RESPIRATORY_TRACT | Status: AC
  Administered 2022-10-23: 10 mg via RESPIRATORY_TRACT
  Filled 2022-10-23 (×2): qty 12

## 2022-10-23 MED ORDER — ALBUTEROL SULFATE (2.5 MG/3ML) 0.083% IN NEBU: 2.5000 mg | INHALATION_SOLUTION | Freq: Four times a day (QID) | RESPIRATORY_TRACT | 12 refills | Status: AC | PRN

## 2022-10-23 MED ORDER — IPRATROPIUM-ALBUTEROL 0.5-2.5 (3) MG/3ML IN SOLN
3.0000 mL | Freq: Once | RESPIRATORY_TRACT | Status: AC
  Administered 2022-10-23: 3 mL via RESPIRATORY_TRACT
  Filled 2022-10-23: qty 3

## 2022-10-23 NOTE — Discharge Instructions (Signed)
For the next 2 days I recommend to use an albuterol nebulizer breathing treatment every 4 hours while you are awake.  After that he can use breathing treatments as needed.  If you do not have access to a nebulizer machine he can also take 2 puffs of albuterol.  Your next course of prednisone steroid will be tomorrow morning.  Please finish the full 5 days as directed.  Follow-up with your primary care doctor.  You should stay in a room temperature apartment or house, avoiding hot or humid temperatures or very cold temperatures

## 2022-10-23 NOTE — ED Provider Triage Note (Signed)
Emergency Medicine Provider Triage Evaluation Note  Joy Richardson , a 56 y.o. female  was evaluated in triage.  Pt complains of shortness of breath with cough for the past 3 days.  Patient states she feels that she is wheezing and has been using her rescue inhaler home with no relief.  She denies chest pain, abdominal pain, nausea, vomiting.  Denies fevers at home.  Review of Systems  Positive: As above Negative: As above  Physical Exam  There were no vitals taken for this visit. Gen:   Awake, no distress   Resp:  Normal effort, expiratory wheezes MSK:   Moves extremities without difficulty  Other:    Medical Decision Making  Medically screening exam initiated at 3:08 PM.  Appropriate orders placed.  Joy Poullard was informed that the remainder of the evaluation will be completed by another provider, this initial triage assessment does not replace that evaluation, and the importance of remaining in the ED until their evaluation is complete.     Darrick Grinder, PA-C 10/23/22 [phone]

## 2022-10-23 NOTE — ED Triage Notes (Signed)
C/o sob with cough and headache x3 days.  Hx asthma Albuterol inhaler with no relief.

## 2022-10-23 NOTE — ED Notes (Signed)
Patient has no concerns after AVS has been reviewed and patient education provided. Patient discharged.Patient has no concerns after AVS has been reviewed and patient education provided. Patient discharged. 

## 2022-10-23 NOTE — ED Provider Notes (Signed)
[facility]-EMERGENCY DEPT Provider Note   CSN: [id number] Arrival date & time: 10/23/22  1453     History  Chief Complaint  Patient presents with   Shortness of Breath    Joy Richardson is a 56 y.o. female w/ hx of asthma presenting to ED with shortness of breath, ongoing for 3 days.  Dry cough, nasal congestion, headache, loose BM.  Lives with 32 year old grandchild at home.  She has a history of asthma has been trying a rescue inhaler but it has not been helping her.  HPI     Home Medications Prior to Admission medications   Medication Sig Start Date End Date Taking? Authorizing Provider  albuterol (PROVENTIL) (2.5 MG/3ML) 0.083% nebulizer solution Take 3 mLs (2.5 mg total) by nebulization every 6 (six) hours as needed for wheezing or shortness of breath. 10/23/22  Yes Keyaira Clapham, Kermit Balo, MD  predniSONE (DELTASONE) 10 MG tablet Take 6 tablets (60 mg total) by mouth daily with breakfast for 5 days. 10/24/22 10/29/22 Yes Hugh Kamara, Kermit Balo, MD  acetaminophen (TYLENOL) 500 MG tablet Take 1,000 mg by mouth every 6 (six) hours as needed for moderate pain.    [provider]  acetaminophen-codeine 120-12 MG/5ML solution Take 10 mLs by mouth every 4 (four) hours as needed (PRN cough). Patient not taking: Reported on 04/06/2020 02/22/18   Charlestine Night, PA-C  albuterol (VENTOLIN HFA) 108 (90 Base) MCG/ACT inhaler Inhale 1-2 puffs into the lungs every 6 (six) hours as needed for wheezing or shortness of breath. 04/06/20   Rancour, Jeannett Senior, MD  cyclobenzaprine (FLEXERIL) 10 MG tablet Take 1 tablet (10 mg total) by mouth 2 (two) times daily as needed for muscle spasms. Patient not taking: Reported on 04/06/2020 07/21/17   Janne Napoleon, NP  diclofenac (VOLTAREN) 50 MG EC tablet Take 1 tablet (50 mg total) by mouth 2 (two) times daily. Patient not taking: Reported on 04/06/2020 07/21/17   Janne Napoleon, NP  doxycycline (VIBRAMYCIN) 100 MG capsule Take 1 capsule  (100 mg total) by mouth 2 (two) times daily. 04/06/20   Rancour, Jeannett Senior, MD  Guaifenesin 1200 MG TB12 Take 1 tablet (1,200 mg total) by mouth 2 (two) times daily. Patient not taking: Reported on 04/06/2020 02/22/18   Lawyer, Cristal Deer, PA-C  Phenylephrine-APAP-guaiFENesin [facility] SINUS-MAX CONGESTION) 10-650-400 MG/20ML LIQD Take 30 mLs by mouth every 6 (six) hours as needed (cough).    [provider]  predniSONE (DELTASONE) 50 MG tablet 1 tablet PO daily 04/06/20   Rancour, Jeannett Senior, MD      Allergies    Penicillins    Review of Systems   Review of Systems  Physical Exam Updated Vital Signs BP (!) 163/80   Pulse 83   Temp 98.2 F (36.8 C)   Resp (!) 21   Ht 5\' 7"  (1.702 m)   Wt 136 kg   SpO2 99%   BMI 46.96 kg/m  Physical Exam Constitutional:      General: She is not in acute distress.    Appearance: She is obese.  HENT:     Head: Normocephalic and atraumatic.  Eyes:     Conjunctiva/sclera: Conjunctivae normal.     Pupils: Pupils are equal, round, and reactive to light.  Cardiovascular:     Rate and Rhythm: Normal rate and regular rhythm.  Pulmonary:     Effort: Pulmonary effort is normal. No respiratory distress.     Comments: Expiratory wheezing Abdominal:     General: There is  no distension.     Tenderness: There is no abdominal tenderness.  Skin:    General: Skin is warm and dry.  Neurological:     General: No focal deficit present.     Mental Status: She is alert. Mental status is at baseline.  Psychiatric:        Mood and Affect: Mood normal.        Behavior: Behavior normal.     ED Results / Procedures / Treatments   Labs (all labs ordered are listed, but only abnormal results are displayed) Labs Reviewed  BASIC METABOLIC PANEL - Abnormal; Notable for the following components:      Result Value   Calcium 8.7 (*)    All other components within normal limits  RESP PANEL BY RT-PCR (FLU A&B, COVID) [id number]  BRAIN NATRIURETIC PEPTIDE     EKG EKG Interpretation  Date/Time:  Thursday October 23 2022 15:03:50 EST Ventricular Rate:  73 PR Interval:  157 QRS Duration: 103 QT Interval:  475 QTC Calculation: 524 R Axis:   -23 Text Interpretation: Sinus rhythm Borderline left axis deviation Probable anterior infarct, age indeterminate Prolonged QT interval Baseline wander in lead(s) I III aVL aVF No significant change since prior 4/21 Confirmed by Meridee Score [phone]) on 10/23/2022 3:15:03 PM  Radiology DG Chest 2 View  Result Date: 10/23/2022 CLINICAL DATA:  Shortness of breath. EXAM: CHEST - 2 VIEW COMPARISON:  April 06, 2020. FINDINGS: The heart size and mediastinal contours are within normal limits. Both lungs are clear. The visualized skeletal structures are unremarkable. IMPRESSION: No active cardiopulmonary disease. Electronically Signed   By: Lupita Raider M.D.   On: 10/23/2022 15:37    Procedures Procedures    Medications Ordered in ED Medications  albuterol (PROVENTIL) (2.5 MG/3ML) 0.083% nebulizer solution 10 mg (0 mg Nebulization Stopped 10/23/22 2003)  ipratropium-albuterol (DUONEB) 0.5-2.5 (3) MG/3ML nebulizer solution 3 mL (3 mLs Nebulization Given 10/23/22 1616)  dexamethasone (DECADRON) injection 10 mg (10 mg Intramuscular Given 10/23/22 1615)  oxymetazoline (AFRIN) 0.05 % nasal spray 1 spray (1 spray Each Nare Given 10/23/22 1755)    ED Course/ Medical Decision Making/ A&P Clinical Course as of 10/23/22 2238  Thu Oct 23, 2022  1746 Patient reassessed continues to have wheezing.  She still feels very nasally congested.  I ordered Afrin spray to help decongest her nose and we will run continuous nebulizers for an hour now.  Awaiting COVID and flu test result [MT]  1912 Patient had improvement of her wheezing with her breathing treatment and ready to go home.  She does have a nebulizer machine that she can access at home that is a family members, and I recommended she use this with albuterol for  the next 2 days, as well as steroids.  She verbalized understanding.  Okay for discharge [MT]    Clinical Course User Index [MT] Montavis Schubring, Kermit Balo, MD                           Medical Decision Making Risk OTC drugs. Prescription drug management.   This patient presents to the ED with concern for shortness of breath. This involves an extensive number of treatment options, and is a complaint that carries with it a high risk of complications and morbidity.  The differential diagnosis includes asthma versus bronchitis versus other viral URI, versus COVID, versus bacterial pneumonia, versus other  Co-morbidities that complicate the patient evaluation: History of asthma high  risk for asthma exacerbation  I ordered and personally interpreted labs.  The pertinent results include: CMP and BMP unremarkable  I ordered imaging studies including x-ray of the chest I independently visualized and interpreted imaging which showed no focal infiltrate, pneumothorax, or large pleural effusion I agree with the radiologist interpretation  The patient was maintained on a cardiac monitor.  I personally viewed and interpreted the cardiac monitored which showed an underlying rhythm of: Sinus rhythm  Per my interpretation the patient's ECG shows sinus rhythm no acute ischemic findings  I ordered medication including DuoNebs and IM Decadron for suspected asthma exacerbation  I have reviewed the patients home medicines and have made adjustments as needed  Test Considered: Lower suspicion for acute PE in this clinical setting, or acute anemia  After the interventions noted above, I reevaluated the patient and found that they have: improved   Dispostion:  After consideration of the diagnostic results and the patients response to treatment, I feel that the patent would benefit from outpatient PCP f/u.         Final Clinical Impression(s) / ED Diagnoses Final diagnoses:  Exacerbation of asthma,  unspecified asthma severity, unspecified whether persistent    Rx / DC Orders ED Discharge Orders          Ordered    predniSONE (DELTASONE) 10 MG tablet  Daily with breakfast        10/23/22 1914    albuterol (PROVENTIL) (2.5 MG/3ML) 0.083% nebulizer solution  Every 6 hours PRN        10/23/22 1914              Terald Sleeper, MD 10/23/22 2238
